# Patient Record
Sex: Male | Born: 1935 | Race: White | Hispanic: No
Health system: Southern US, Community
[De-identification: ages and names within clinical notes are randomized; demographics above are authoritative.]

## PROBLEM LIST (undated history)

## (undated) DIAGNOSIS — R569 Unspecified convulsions: Secondary | ICD-10-CM

---

## 2005-05-09 ENCOUNTER — Inpatient Hospital Stay: Payer: Self-pay | Admitting: Vascular Surgery

## 2005-05-09 ENCOUNTER — Other Ambulatory Visit: Payer: Self-pay

## 2005-05-14 ENCOUNTER — Other Ambulatory Visit: Payer: Self-pay

## 2005-05-14 ENCOUNTER — Ambulatory Visit: Payer: Self-pay | Admitting: Surgery

## 2007-10-04 ENCOUNTER — Ambulatory Visit: Payer: Self-pay | Admitting: Gastroenterology

## 2011-11-22 LAB — COMPREHENSIVE METABOLIC PANEL
Alkaline Phosphatase: 75 U/L (ref 50–136)
Anion Gap: 6 — ABNORMAL LOW (ref 7–16)
BUN: 4 mg/dL — ABNORMAL LOW (ref 7–18)
Bilirubin,Total: 0.6 mg/dL (ref 0.2–1.0)
Calcium, Total: 8.7 mg/dL (ref 8.5–10.1)
Co2: 32 mmol/L (ref 21–32)
EGFR (African American): >60
Glucose: 108 mg/dL — ABNORMAL HIGH (ref 65–99)
Osmolality: 277 (ref 275–301)
Potassium: 3.2 mmol/L — ABNORMAL LOW (ref 3.5–5.1)
SGOT(AST): 35 U/L (ref 15–37)
SGPT (ALT): 31 U/L

## 2011-11-22 LAB — URINALYSIS, COMPLETE
Bacteria: NONE SEEN
Bilirubin,UR: NEGATIVE
Blood: NEGATIVE
Glucose,UR: NEGATIVE mg/dL (ref 0–75)
Leukocyte Esterase: NEGATIVE
Nitrite: NEGATIVE
Protein: NEGATIVE
Specific Gravity: 1.01 (ref 1.003–1.030)

## 2011-11-22 LAB — CBC
HCT: 49.9 % (ref 40.0–52.0)
HGB: 16.6 g/dL (ref 13.0–18.0)
MCV: 89 fL (ref 80–100)
RBC: 5.61 10*6/uL (ref 4.40–5.90)
RDW: 14.8 % — ABNORMAL HIGH (ref 11.5–14.5)
WBC: 5.4 10*3/uL (ref 3.8–10.6)

## 2011-11-22 LAB — TROPONIN I: Troponin-I: <0.02 ng/mL

## 2011-11-23 ENCOUNTER — Observation Stay: Payer: Self-pay | Admitting: Internal Medicine

## 2011-11-25 ENCOUNTER — Encounter: Payer: Self-pay | Admitting: Internal Medicine

## 2011-12-23 ENCOUNTER — Encounter: Payer: Self-pay | Admitting: Internal Medicine

## 2012-04-04 ENCOUNTER — Emergency Department: Payer: Self-pay | Admitting: Emergency Medicine

## 2012-04-04 LAB — URINALYSIS, COMPLETE
Bilirubin,UR: NEGATIVE
Blood: NEGATIVE
Glucose,UR: NEGATIVE mg/dL (ref 0–75)
Ketone: NEGATIVE
Nitrite: NEGATIVE
Ph: 5 (ref 4.5–8.0)
Protein: NEGATIVE
Specific Gravity: 1.02 (ref 1.003–1.030)
Squamous Epithelial: <1
WBC UR: 2 /HPF (ref 0–5)

## 2012-04-04 LAB — COMPREHENSIVE METABOLIC PANEL
Albumin: 4.1 g/dL (ref 3.4–5.0)
Alkaline Phosphatase: 66 U/L (ref 50–136)
Bilirubin,Total: 0.5 mg/dL (ref 0.2–1.0)
Calcium, Total: 8.9 mg/dL (ref 8.5–10.1)
Creatinine: 1.18 mg/dL (ref 0.60–1.30)
EGFR (Non-African Amer.): 59 — ABNORMAL LOW
Glucose: 122 mg/dL — ABNORMAL HIGH (ref 65–99)
Osmolality: 284 (ref 275–301)
SGPT (ALT): 23 U/L (ref 12–78)
Sodium: 142 mmol/L (ref 136–145)
Total Protein: 7.3 g/dL (ref 6.4–8.2)

## 2012-04-04 LAB — CBC
HGB: 16.4 g/dL (ref 13.0–18.0)
MCH: 30.4 pg (ref 26.0–34.0)
Platelet: 182 10*3/uL (ref 150–440)
RBC: 5.39 10*6/uL (ref 4.40–5.90)
RDW: 14.2 % (ref 11.5–14.5)

## 2012-04-04 LAB — TROPONIN I: Troponin-I: <0.02 ng/mL

## 2013-02-20 ENCOUNTER — Emergency Department: Payer: Self-pay

## 2013-02-20 LAB — COMPREHENSIVE METABOLIC PANEL
Alkaline Phosphatase: 77 U/L (ref 50–136)
Anion Gap: 6 — ABNORMAL LOW (ref 7–16)
BUN: 10 mg/dL (ref 7–18)
Calcium, Total: 8.7 mg/dL (ref 8.5–10.1)
Chloride: 103 mmol/L (ref 98–107)
Co2: 27 mmol/L (ref 21–32)
Creatinine: 1.08 mg/dL (ref 0.60–1.30)
EGFR (African American): >60
Glucose: 132 mg/dL — ABNORMAL HIGH (ref 65–99)
SGOT(AST): 25 U/L (ref 15–37)
Total Protein: 7 g/dL (ref 6.4–8.2)

## 2013-02-20 LAB — ETHANOL
Ethanol %: <0.003 % (ref 0.000–0.080)
Ethanol: <3 mg/dL

## 2013-02-20 LAB — URINALYSIS, COMPLETE
Bacteria: NONE SEEN
Blood: NEGATIVE
Glucose,UR: NEGATIVE mg/dL (ref 0–75)
Ketone: NEGATIVE
Leukocyte Esterase: NEGATIVE
Nitrite: NEGATIVE
RBC,UR: <1 /HPF (ref 0–5)
Squamous Epithelial: NONE SEEN
WBC UR: <1 /HPF (ref 0–5)

## 2013-02-20 LAB — DRUG SCREEN, URINE
Amphetamines, Ur Screen: NEGATIVE (ref ?–1000)
Barbiturates, Ur Screen: NEGATIVE (ref ?–200)
Benzodiazepine, Ur Scrn: NEGATIVE (ref ?–200)
Cannabinoid 50 Ng, Ur ~~LOC~~: NEGATIVE (ref ?–50)
Cocaine Metabolite,Ur ~~LOC~~: NEGATIVE (ref ?–300)
MDMA (Ecstasy)Ur Screen: NEGATIVE (ref ?–500)
Methadone, Ur Screen: NEGATIVE (ref ?–300)
Phencyclidine (PCP) Ur S: NEGATIVE (ref ?–25)

## 2013-02-20 LAB — CBC
HGB: 16.2 g/dL (ref 13.0–18.0)
MCH: 29.5 pg (ref 26.0–34.0)
RBC: 5.5 10*6/uL (ref 4.40–5.90)
WBC: 6.5 10*3/uL (ref 3.8–10.6)

## 2013-02-21 ENCOUNTER — Ambulatory Visit: Payer: Self-pay | Admitting: Nurse Practitioner

## 2013-03-25 ENCOUNTER — Emergency Department (HOSPITAL_COMMUNITY)
Admission: EM | Admit: 2013-03-25 | Discharge: 2013-03-26 | Disposition: A | Discharge status: Skilled Nursing Facility | Payer: Medicare Other | Attending: Emergency Medicine | Admitting: Emergency Medicine

## 2013-03-25 DIAGNOSIS — F29 Unspecified psychosis not due to a substance or known physiological condition: Secondary | ICD-10-CM | POA: Insufficient documentation

## 2013-03-25 DIAGNOSIS — Z043 Encounter for examination and observation following other accident: Secondary | ICD-10-CM | POA: Insufficient documentation

## 2013-03-25 DIAGNOSIS — IMO0002 Reserved for concepts with insufficient information to code with codable children: Secondary | ICD-10-CM | POA: Insufficient documentation

## 2013-03-25 DIAGNOSIS — W19XXXA Unspecified fall, initial encounter: Secondary | ICD-10-CM | POA: Insufficient documentation

## 2013-03-25 DIAGNOSIS — Y9210 Unspecified residential institution as the place of occurrence of the external cause: Secondary | ICD-10-CM | POA: Insufficient documentation

## 2013-03-25 DIAGNOSIS — Z79899 Other long term (current) drug therapy: Secondary | ICD-10-CM | POA: Insufficient documentation

## 2013-03-25 DIAGNOSIS — M4000 Postural kyphosis, site unspecified: Secondary | ICD-10-CM | POA: Insufficient documentation

## 2013-03-25 DIAGNOSIS — Y939 Activity, unspecified: Secondary | ICD-10-CM | POA: Insufficient documentation

## 2013-03-25 DIAGNOSIS — F0390 Unspecified dementia without behavioral disturbance: Secondary | ICD-10-CM | POA: Insufficient documentation

## 2013-03-25 HISTORY — DX: Unspecified convulsions: R56.9

## 2013-03-25 NOTE — ED Notes (Signed)
Pt to ED via [facility] from [facility] after reported falling.  Staff reported to EMS that they found pt on floor.

## 2013-03-26 ENCOUNTER — Emergency Department (HOSPITAL_COMMUNITY): Payer: Medicare Other

## 2013-03-26 ENCOUNTER — Encounter (HOSPITAL_COMMUNITY): Payer: Self-pay | Admitting: Emergency Medicine

## 2013-03-26 LAB — BASIC METABOLIC PANEL
BUN: 12 mg/dL (ref 6–23)
Chloride: 102 mEq/L (ref 96–112)
GFR calc Af Amer: 89 mL/min — ABNORMAL LOW (ref >90)
GFR calc non Af Amer: 77 mL/min — ABNORMAL LOW (ref >90)
Glucose, Bld: 99 mg/dL (ref 70–99)
Potassium: 3.6 mEq/L (ref 3.5–5.1)
Sodium: 139 mEq/L (ref 135–145)

## 2013-03-26 LAB — URINALYSIS, ROUTINE W REFLEX MICROSCOPIC
Glucose, UA: NEGATIVE mg/dL
Leukocytes, UA: NEGATIVE
Nitrite: NEGATIVE
Protein, ur: NEGATIVE mg/dL
Specific Gravity, Urine: 1.029 (ref 1.005–1.030)
pH: 5 (ref 5.0–8.0)

## 2013-03-26 LAB — CBC
HCT: 45.9 % (ref 39.0–52.0)
Hemoglobin: 16 g/dL (ref 13.0–17.0)
MCHC: 34.9 g/dL (ref 30.0–36.0)
MCV: 85.6 fL (ref 78.0–100.0)
RBC: 5.36 MIL/uL (ref 4.22–5.81)
WBC: 5.4 10*3/uL (ref 4.0–10.5)

## 2013-03-26 LAB — URINE MICROSCOPIC-ADD ON

## 2013-03-26 NOTE — ED Provider Notes (Signed)
CSN: [id number]     Arrival date & time 03/25/13  2335 History   First MD Initiated Contact with Patient 03/26/13 0011     Chief Complaint  Patient presents with  . Fall   (Consider location/radiation/quality/duration/timing/severity/associated sxs/prior Treatment) HPI This is an elderly man with a history of dementia who was brought to the emergency department by ambulance from her skilled nursing facility where he resides. He came from [facility]. Staff told the patient denies that he has been a resident there for a one week and that they do not know very much about him.   I am unable to obtain any useful information from the patient who is alert but, seemingly, very confused.  History reviewed. No pertinent past medical history. History reviewed. No pertinent past surgical history. No family history on file. History  Substance Use Topics  . Smoking status: Not on file  . Smokeless tobacco: Not on file  . Alcohol Use: Not on file    Review of Systems Unable to obtain review of systems from the patient because of his altered mental status. Please note level V caveat  Allergies  Review of patient's allergies indicates not on file.  Home Medications   Current Outpatient Rx  Name  Route  Sig  Dispense  Refill  . acetaminophen (TYLENOL) 325 MG tablet   Oral   Take 650 mg by mouth every 6 (six) hours as needed for pain.         Marland Kitchen alum & mag hydroxide-simeth (MAALOX PLUS) 400-400-40 MG/5ML suspension   Oral   Take 30 mLs by mouth as needed for indigestion.          . Melatonin 3 MG TABS   Oral   Take 1 tablet by mouth at bedtime.         . Oxcarbazepine (TRILEPTAL) 300 MG tablet   Oral   Take 300 mg by mouth 2 (two) times daily.         . QUEtiapine (SEROQUEL) 25 MG tablet   Oral   Take 12.5 mg by mouth at bedtime.         . traZODone (DESYREL) 50 MG tablet   Oral   Take 50 mg by mouth at bedtime.          BP 114/76  Temp(Src) 97.9 F (36.6 C)  (Oral)  SpO2 98% Physical Exam Gen: well developed and well nourished appearing, does not appear to be in acute distress Head: NCAT Eyes: PERL, EOMI Nose: no epistaixis or rhinorrhea Mouth/throat: mucosa is moist and pink Neck: supple, no c spine ttp Lungs: CTA B, no wheezing, rhonchi or rales CV: RRR, good  Peripheral pulses x 4 Abd: soft, notender, nondistended Back: mild kyhposis, no midline ttp Skin: warm and dry, no echymosis, abrasion, laceration, hematoma identified  Neuro: CN ii-xii grossly intact, no focal motor deficits, the patient is unable to name himself, the place or the date,  Psyche; sleeping but easily arousble then mildly agitated affect Ext: no deformities, normal to inspection, FROM without pain all joint of all extremities  ED Course  Procedures (including critical care time) Labs Review  Results for orders placed during the hospital encounter of 03/25/13 (from the past 24 hour(s))  URINALYSIS, ROUTINE W REFLEX MICROSCOPIC     Status: Abnormal   Collection Time    03/26/13  1:54 AM      Result Value Range   Color, Urine AMBER (*) YELLOW   APPearance CLEAR  CLEAR  Specific Gravity, Urine 1.029  1.005 - 1.030   pH 5.0  5.0 - 8.0   Glucose, UA NEGATIVE  NEGATIVE mg/dL   Hgb urine dipstick MODERATE (*) NEGATIVE   Bilirubin Urine SMALL (*) NEGATIVE   Ketones, ur 15 (*) NEGATIVE mg/dL   Protein, ur NEGATIVE  NEGATIVE mg/dL   Urobilinogen, UA 0.2  0.0 - 1.0 mg/dL   Nitrite NEGATIVE  NEGATIVE   Leukocytes, UA NEGATIVE  NEGATIVE  URINE MICROSCOPIC-ADD ON     Status: None   Collection Time    03/26/13  1:54 AM      Result Value Range   Squamous Epithelial / LPF RARE  RARE   RBC / HPF 11-20  <3 RBC/hpf   Bacteria, UA RARE  RARE  CBC     Status: None   Collection Time    03/26/13  2:45 AM      Result Value Range   WBC 5.4  4.0 - 10.5 K/uL   RBC 5.36  4.22 - 5.81 MIL/uL   Hemoglobin 16.0  13.0 - 17.0 g/dL   HCT 40.9  81.1 - 91.4 %   MCV 85.6  78.0 -  100.0 fL   MCH 29.9  26.0 - 34.0 pg   MCHC 34.9  30.0 - 36.0 g/dL   RDW 78.2  95.6 - 21.3 %   Platelets 174  150 - 400 K/uL  BASIC METABOLIC PANEL     Status: Abnormal   Collection Time    03/26/13  2:45 AM      Result Value Range   Sodium 139  135 - 145 mEq/L   Potassium 3.6  3.5 - 5.1 mEq/L   Chloride 102  96 - 112 mEq/L   CO2 27  19 - 32 mEq/L   Glucose, Bld 99  70 - 99 mg/dL   BUN 12  6 - 23 mg/dL   Creatinine, Ser 0.86  0.50 - 1.35 mg/dL   Calcium 8.9  8.4 - 57.8 mg/dL   GFR calc non Af Amer 77 (*) >90 mL/min   GFR calc Af Amer 89 (*) >90 mL/min   CXR - no acute process  CT brain - encephalomalacia, chronic infarct  EKG: NSR, normal rate, normal axis, normal qrs  MDM  Patient with history of dementia. More alert than on arrival. Confused and agitated. No acute traumatic injuries identified. Patient is stable for discharge.     Brandt Loosen, MD 03/26/13 [phone]

## 2013-03-26 NOTE — ED Notes (Signed)
Pt refused to do pelvic xray

## 2013-03-26 NOTE — ED Notes (Addendum)
Pt ambulating in hallway. Stable on feet. No complaints of pain.

## 2013-04-10 ENCOUNTER — Encounter (HOSPITAL_COMMUNITY): Payer: Self-pay | Admitting: Emergency Medicine

## 2013-04-10 ENCOUNTER — Emergency Department (HOSPITAL_COMMUNITY)
Admission: EM | Admit: 2013-04-10 | Discharge: 2013-04-12 | Disposition: A | Discharge status: Skilled Nursing Facility | Payer: Medicare Other | Attending: Emergency Medicine | Admitting: Emergency Medicine

## 2013-04-10 DIAGNOSIS — Z79899 Other long term (current) drug therapy: Secondary | ICD-10-CM | POA: Insufficient documentation

## 2013-04-10 DIAGNOSIS — F0390 Unspecified dementia without behavioral disturbance: Secondary | ICD-10-CM

## 2013-04-10 DIAGNOSIS — F911 Conduct disorder, childhood-onset type: Secondary | ICD-10-CM | POA: Insufficient documentation

## 2013-04-10 DIAGNOSIS — N390 Urinary tract infection, site not specified: Secondary | ICD-10-CM

## 2013-04-10 DIAGNOSIS — G40909 Epilepsy, unspecified, not intractable, without status epilepticus: Secondary | ICD-10-CM | POA: Insufficient documentation

## 2013-04-10 DIAGNOSIS — R4689 Other symptoms and signs involving appearance and behavior: Secondary | ICD-10-CM

## 2013-04-10 LAB — CBC WITH DIFFERENTIAL/PLATELET
Basophils Absolute: 0 10*3/uL (ref 0.0–0.1)
Basophils Relative: 1 % (ref 0–1)
Eosinophils Absolute: 0.1 10*3/uL (ref 0.0–0.7)
Eosinophils Relative: 2 % (ref 0–5)
HCT: 45.9 % (ref 39.0–52.0)
Hemoglobin: 15.2 g/dL (ref 13.0–17.0)
Lymphocytes Relative: 30 % (ref 12–46)
Lymphs Abs: 1.3 10*3/uL (ref 0.7–4.0)
MCH: 29.1 pg (ref 26.0–34.0)
MCHC: 33.1 g/dL (ref 30.0–36.0)
MCV: 87.9 fL (ref 78.0–100.0)
Monocytes Absolute: 0.4 10*3/uL (ref 0.1–1.0)
Monocytes Relative: 9 % (ref 3–12)
Neutro Abs: 2.6 10*3/uL (ref 1.7–7.7)
Neutrophils Relative %: 59 % (ref 43–77)
Platelets: 165 10*3/uL (ref 150–400)
RBC: 5.22 MIL/uL (ref 4.22–5.81)
RDW: 14.8 % (ref 11.5–15.5)
WBC: 4.3 10*3/uL (ref 4.0–10.5)

## 2013-04-10 LAB — URINALYSIS, ROUTINE W REFLEX MICROSCOPIC
Bilirubin Urine: NEGATIVE
Glucose, UA: NEGATIVE mg/dL
Hgb urine dipstick: NEGATIVE
Ketones, ur: NEGATIVE mg/dL
Nitrite: NEGATIVE
Protein, ur: NEGATIVE mg/dL
Specific Gravity, Urine: 1.03 (ref 1.005–1.030)
Urobilinogen, UA: 0.2 mg/dL (ref 0.0–1.0)
pH: 7.5 (ref 5.0–8.0)

## 2013-04-10 LAB — COMPREHENSIVE METABOLIC PANEL
ALT: 16 U/L (ref 0–53)
AST: 20 U/L (ref 0–37)
Albumin: 3.9 g/dL (ref 3.5–5.2)
Alkaline Phosphatase: 71 U/L (ref 39–117)
BUN: 10 mg/dL (ref 6–23)
CO2: 29 mEq/L (ref 19–32)
Calcium: 9.4 mg/dL (ref 8.4–10.5)
Chloride: 103 mEq/L (ref 96–112)
Creatinine, Ser: 0.89 mg/dL (ref 0.50–1.35)
GFR calc Af Amer: >90 mL/min (ref >90)
GFR calc non Af Amer: 80 mL/min — ABNORMAL LOW (ref >90)
Glucose, Bld: 102 mg/dL — ABNORMAL HIGH (ref 70–99)
Potassium: 3.8 mEq/L (ref 3.5–5.1)
Sodium: 140 mEq/L (ref 135–145)
Total Bilirubin: 0.4 mg/dL (ref 0.3–1.2)
Total Protein: 7.1 g/dL (ref 6.0–8.3)

## 2013-04-10 LAB — URINE MICROSCOPIC-ADD ON

## 2013-04-10 MED ORDER — CIPROFLOXACIN HCL 500 MG PO TABS
500.0000 mg | ORAL_TABLET | Freq: Once | ORAL | Status: AC
  Administered 2013-04-10: 500 mg via ORAL
  Filled 2013-04-10: qty 1

## 2013-04-10 MED ORDER — CIPROFLOXACIN HCL 500 MG PO TABS
500.0000 mg | ORAL_TABLET | Freq: Two times a day (BID) | ORAL | Status: DC
  Administered 2013-04-11 – 2013-04-12 (×3): 500 mg via ORAL
  Filled 2013-04-10 (×3): qty 1

## 2013-04-10 MED ORDER — HALOPERIDOL LACTATE 5 MG/ML IJ SOLN
3.0000 mg | Freq: Once | INTRAMUSCULAR | Status: AC
  Administered 2013-04-10: 3 mg via INTRAMUSCULAR
  Filled 2013-04-10: qty 1

## 2013-04-10 MED ORDER — QUETIAPINE FUMARATE 50 MG PO TABS
50.0000 mg | ORAL_TABLET | Freq: Two times a day (BID) | ORAL | Status: DC
  Administered 2013-04-10 – 2013-04-12 (×4): 50 mg via ORAL
  Filled 2013-04-10 (×5): qty 1

## 2013-04-10 MED ORDER — QUETIAPINE FUMARATE 25 MG PO TABS
12.5000 mg | ORAL_TABLET | Freq: Every day | ORAL | Status: DC
  Administered 2013-04-10: 75 mg via ORAL
  Administered 2013-04-11: 12.5 mg via ORAL
  Filled 2013-04-10 (×2): qty 1

## 2013-04-10 MED ORDER — TRAZODONE HCL 50 MG PO TABS
50.0000 mg | ORAL_TABLET | Freq: Every day | ORAL | Status: DC
  Administered 2013-04-10 – 2013-04-11 (×2): 50 mg via ORAL
  Filled 2013-04-10 (×2): qty 1

## 2013-04-10 MED ORDER — HALOPERIDOL 2 MG PO TABS
2.0000 mg | ORAL_TABLET | Freq: Four times a day (QID) | ORAL | Status: DC | PRN
  Administered 2013-04-11 – 2013-04-12 (×3): 2 mg via ORAL
  Filled 2013-04-10 (×4): qty 1

## 2013-04-10 MED ORDER — OXCARBAZEPINE 300 MG PO TABS
300.0000 mg | ORAL_TABLET | Freq: Two times a day (BID) | ORAL | Status: DC
  Administered 2013-04-10 – 2013-04-12 (×4): 300 mg via ORAL
  Filled 2013-04-10 (×7): qty 1

## 2013-04-10 MED ORDER — MELATONIN 3 MG PO TABS: 1.0000 | ORAL_TABLET | Freq: Every day | ORAL | Status: DC

## 2013-04-10 MED ORDER — LORAZEPAM 1 MG PO TABS
1.0000 mg | ORAL_TABLET | Freq: Once | ORAL | Status: AC
  Administered 2013-04-10: 1 mg via ORAL
  Filled 2013-04-10: qty 2
  Filled 2013-04-10: qty 1

## 2013-04-10 MED ORDER — ALUM & MAG HYDROXIDE-SIMETH 400-400-40 MG/5ML PO SUSP
30.0000 mL | ORAL | Status: DC | PRN
  Filled 2013-04-10: qty 30

## 2013-04-10 NOTE — ED Notes (Signed)
Upon arrival to floor for report,  Pt is urinating in floor and now has began wondering halls. Morrie Sheldon RN is speaking with MD about pt's behavior.

## 2013-04-10 NOTE — Progress Notes (Signed)
PHARMACIST - PHYSICIAN ORDER COMMUNICATION  CONCERNING: P&T Medication Policy on Herbal Medications  DESCRIPTION:  This patient's order for:  MELATONIN has been noted.  This product(s) is classified as an "herbal" or natural product. Due to a lack of definitive safety studies or FDA approval, nonstandard manufacturing practices, plus the potential risk of unknown drug-drug interactions while on inpatient medications, the Pharmacy and Therapeutics Committee does not permit the use of "herbal" or natural products of this type within [facility].   ACTION TAKEN: The pharmacy department is unable to verify this order at this time and your patient has been informed of this safety policy. Please reevaluate patient's clinical condition at discharge and address if the herbal or natural product(s) should be resumed at that time.   Clydene Fake PharmD Pager #: [phone] 6:01 PM 04/10/2013

## 2013-04-10 NOTE — ED Notes (Signed)
Per EMS ambulatory pt from [facility] reports to ED for aggression.  DNR, Hx of seizures and dementia. Mentally at baseline per EMS.

## 2013-04-10 NOTE — ED Notes (Signed)
Patient continues to get oob without assistance. Patient has been encouraged and informed to remain in bed. And call for help to get oob for safety reasons. Will cont to monitor patient.

## 2013-04-10 NOTE — ED Notes (Signed)
PT NEED ENCOURAGEMENT TO REMAIN IN ROOM. PT PLEASANT AND COOPERATE

## 2013-04-10 NOTE — ED Notes (Signed)
CRYSTAL FROM [facility] STATES PT HAS HAD SEVERAL BEHAVIOR EVENTS AT THEIR FACILITY AND WILL NEED BEHAVIOR ASSESSMENT WITH TREATMENT BEFORE PT WILL BE ACCEPTED BACK TO FACILITY OTHERWISE PT WILL HAVE TO BE PLACE ELSEWHERE.  PER DAUGHTER [name] PT HAS BEEN PLACED AT 3 OTHER FACILITIES AND HAS BEEN MOVED BECAUSE OF BEHAVIOR PROBLEMS. SEE CHART.

## 2013-04-10 NOTE — ED Provider Notes (Signed)
CSN: [id number]     Arrival date & time 04/10/13  1230 History   First MD Initiated Contact with Patient 04/10/13 1459     Chief Complaint  Patient presents with  . Aggressive Behavior   (Consider location/radiation/quality/duration/timing/severity/associated sxs/prior Treatment) HPI  80yM sent for evaluation of aggressive behavior. Unfortunately not much additional history. Exact details of this not clear from transfer summary, but pt apparently has a history of aggressive behavior and has been dismissed from several other nursing homes because of his behavior. [facility] will only accept pt back after he has a psychiatric evaluation and deemed appropriate to go back to their facility. Pt with no complaints. When asked if anything is bothering him he replied: "Shit no. I'm like an old [place]. I just go." Pt generally rambling and seems confused. Often times losing train of thought mid sentence. No reported trauma. Pt thinks he is still in [place] which is where he presumably once resided. 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   Past Medical History  Diagnosis Date  . Seizures    History reviewed. No pertinent past surgical history. History reviewed. No pertinent family history. History  Substance Use Topics  . Smoking status: Not on file  . Smokeless tobacco: Not on file  . Alcohol Use: No    Review of Systems  All systems reviewed and negative, other than as noted in HPI.   Allergies  Review of patient's allergies indicates no known allergies.  Home Medications   Current Outpatient Rx  Name  Route  Sig  Dispense  Refill  . acetaminophen (TYLENOL) 325 MG tablet   Oral   Take 650 mg by mouth every 6 (six) hours as needed for pain.         Marland Kitchen alum & mag hydroxide-simeth (MAALOX PLUS) 400-400-40 MG/5ML suspension   Oral   Take 30 mLs by mouth as needed for indigestion.          . haloperidol (HALDOL) 2 MG tablet   Oral   Take 2 mg by mouth every 6 (six)  hours as needed for agitation.         . Melatonin 3 MG TABS   Oral   Take 1 tablet by mouth at bedtime.         . Oxcarbazepine (TRILEPTAL) 300 MG tablet   Oral   Take 300 mg by mouth 2 (two) times daily.         . QUEtiapine (SEROQUEL) 25 MG tablet   Oral   Take 12.5 mg by mouth at bedtime.         Marland Kitchen QUEtiapine (SEROQUEL) 50 MG tablet   Oral   Take 50 mg by mouth 2 (two) times daily.         . traZODone (DESYREL) 50 MG tablet   Oral   Take 50 mg by mouth at bedtime.          BP 120/72  Pulse 56  Temp(Src) 98.1 F (36.7 C) (Oral)  Resp 18  SpO2 97% Physical Exam  Nursing note and vitals reviewed. Constitutional: He appears well-developed and well-nourished. No distress.  HENT:  Head: Normocephalic and atraumatic.  Eyes: Conjunctivae are normal. Pupils are equal, round, and reactive to light. Right eye exhibits no discharge. Left eye exhibits no discharge.  Neck: Neck supple.  Cardiovascular: Normal rate, regular rhythm and normal heart sounds.  Exam reveals no gallop and no friction rub.   No murmur heard. Pulmonary/Chest: Effort normal and  breath sounds normal. No respiratory distress.  Abdominal: Soft. He exhibits no distension. There is no tenderness.  Musculoskeletal: He exhibits no edema and no tenderness.  Neurological: He is alert.  Skin: Skin is warm and dry. He is not diaphoretic.  Psychiatric: He has a normal mood and affect.  Laying in bed. Somewhat restless and frequently rubbing legs. Speech clear. Starts answers questions appropriately but then either becomes tangential or just stops in mid sentences.     ED Course  Procedures (including critical care time) Labs Review Labs Reviewed  COMPREHENSIVE METABOLIC PANEL - Abnormal; Notable for the following:    Glucose, Bld 102 (*)    GFR calc non Af Amer 80 (*)    All other components within normal limits  URINALYSIS, ROUTINE W REFLEX MICROSCOPIC - Abnormal; Notable for the following:     APPearance CLOUDY (*)    Leukocytes, UA SMALL (*)    All other components within normal limits  URINE MICROSCOPIC-ADD ON - Abnormal; Notable for the following:    Bacteria, UA MANY (*)    All other components within normal limits  URINE CULTURE  CBC WITH DIFFERENTIAL   Imaging Review No results found.  EKG Interpretation   None       MDM   1. Aggressive behavior   2. UTI (urinary tract infection)     80yM with aggressive behavior. Has a hx of same. Does not appear to be acute issue. On medications presumably to address this. Will medically clear. Will obtain psychiatry consultation to help assess appropriateness for discharge back to current facility or in need of additional services as well as possible medication recommendations.    4:58 PM UA with possible UTI. Abx ordered.     Raeford Razor, MD 04/10/13 1700

## 2013-04-11 ENCOUNTER — Emergency Department (HOSPITAL_COMMUNITY): Payer: Medicare Other

## 2013-04-11 ENCOUNTER — Encounter (HOSPITAL_COMMUNITY): Payer: Self-pay | Admitting: Registered Nurse

## 2013-04-11 DIAGNOSIS — F0390 Unspecified dementia without behavioral disturbance: Secondary | ICD-10-CM

## 2013-04-11 DIAGNOSIS — F603 Borderline personality disorder: Secondary | ICD-10-CM

## 2013-04-11 LAB — URINE CULTURE
Colony Count: NO GROWTH
Culture: NO GROWTH

## 2013-04-11 NOTE — ED Notes (Signed)
Pt ate 100% of meal.

## 2013-04-11 NOTE — ED Notes (Signed)
Breakfast tray to bedside.  Pt is sleeping.

## 2013-04-11 NOTE — ED Notes (Signed)
Pt stood in doorway of room and urinated in the floor.  Pt confused and unaware of actions.  Pt redirected to bed.  Diaper replaced and pants put back on.  Pt educated on bathroom use.  Light is on in bathroom to help pt find his way to it when needed.

## 2013-04-11 NOTE — ED Notes (Signed)
Patient transported to X-ray 
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
## 2013-04-11 NOTE — Progress Notes (Signed)
Pt referred to [facility], [facility], and [facility].  No beds available at [facility] and [facility]  .Marva Panda, LCSWA  [phone]  .04/11/2013

## 2013-04-11 NOTE — Progress Notes (Signed)
[facility] ED CM answered a call from Crystal from pt's facility inquiring about an update on pt. She was informed that ED [facility] staff were working on placement for pt but not sure if he would be returned to facility per her inquiry). Crystal requests a return call from ED SW for clarity on disposition plan. Crystal states her facility does psych evaluation visits weekly and reports pt would not be able to return to facility unless he was more mentally stable. ED CM called ED SW to update her on call from Crystal and request for a follow up call r/t disposition

## 2013-04-11 NOTE — ED Provider Notes (Signed)
Date: 04/11/2013  Rate: 54  Rhythm: sinus bradycardia  QRS Axis: normal  Intervals: normal  ST/T Wave abnormalities: normal  Conduction Disutrbances:none  Narrative Interpretation:   Old EKG Reviewed: unchanged    Gilda Crease, MD 04/11/13 1758
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
## 2013-04-12 ENCOUNTER — Encounter (HOSPITAL_COMMUNITY): Payer: Self-pay | Admitting: Registered Nurse

## 2013-04-12 MED ORDER — CIPROFLOXACIN HCL 250 MG PO TABS: 250.0000 mg | ORAL_TABLET | Freq: Two times a day (BID) | ORAL | Status: AC
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
## 2013-04-12 NOTE — ED Notes (Signed)
Pt ambulating in room, walked to BR unassited and back to bed at this time

## 2013-04-12 NOTE — Progress Notes (Signed)
[facility] consulted by EDSW for home health services.  EDCM spoke to [employer] at [facility] at [phone].  As per Aggie Cosier, their facility uses [place] [facility] for home health services.  EDCM placed orders for home health psych  RN for disease management and medication assistance and physical therapy.  Call placed to Venia Minks of [place] to make her aware of the new orders.  Awaiting call back.

## 2013-04-12 NOTE — ED Notes (Signed)
[facility] called for transport.  

## 2013-04-12 NOTE — Progress Notes (Signed)
Crystal at [facility] confirmed patient's pcp at the facility is Dr. Cristi Loron.  System updated.

## 2013-04-12 NOTE — Consult Note (Signed)
  Psychiatric Specialty Exam: Physical Exam  ROS  Blood pressure 93/49, pulse 58, temperature 97.9 F (36.6 C), temperature source Oral, resp. rate 16, SpO2 95.00%.There is no height or weight on file to calculate BMI.  General Appearance: Disheveled  Eye Contact::  Minimal  Speech:  Slow  Volume:  Decreased  Mood:  Euthymic  Affect:  Blunt  Thought Process:  poverty of speech   Orientation:  Other:  not oriented except to his nome  Thought Content:  Negative  Suicidal Thoughts:  No  Homicidal Thoughts:  No  Memory:  Immediate;   Poor Recent;   Poor Remote;   Poor  Judgement:  Impaired  Insight:  Lacking  Psychomotor Activity:  currently in bed  Concentration:  Poor  Recall:  Poor  Akathisia:  Negative  Handed:  Right  AIMS (if indicated):     Assets:  Housing  Sleep:   good   [name] is better today.  He makes better eye contact and is appropriate though he does not know the answers to most questions.  He has not been combative and has been cooperative.  The UTI is being treated and perhaps that is why he is better or it might be just a change of place.  He is free to be returned to assisted living today.

## 2013-04-12 NOTE — Progress Notes (Signed)
Referred to [place]. [facility]ane [facility], [place], [place] and [place].   Incoming staff will follow-up with referrals.

## 2013-04-12 NOTE — ED Notes (Signed)
Attempted to give pt Haldol as he was getting agitated, pt spit tablet in the toilet.

## 2013-04-12 NOTE — Discharge Instructions (Signed)
Back to NH.  If you were given medicines take as directed.  If you are on coumadin or contraceptives realize their levels and effectiveness is altered by many different medicines.  If you have any reaction (rash, tongues swelling, other) to the medicines stop taking and see a physician.   Please follow up as directed and return to the ER or see a physician for new or worsening symptoms.  Thank you.

## 2013-04-12 NOTE — Progress Notes (Signed)
Return call received from Venia Minks of [place].  Will acknowledge orders and initiate home health services as per Debbie.

## 2013-04-12 NOTE — Progress Notes (Addendum)
CSW received call from Hume at [facility] confirming that pt can return.  Social worker confirmed with her that [facility] has set up home health services that includes psych services as requested.  Crystal requested that EDP fax antibiotic orders over to [facility] fax: [phone] prior to pt returning so that they could make sure he has his dosage for tonight.  Crystal reports that she will not be available but that Victorino Dike will be available until 5:30 if there are questions.  Marva Panda, Theresia Majors  [phone] .04/12/2013 3:45 pm  CSW consulted with EDP concerning pts prescription and pts ready to be discharged back to his facility.  CSW faxed pts prescription and confirmed with Crystal that it was received.   CSW alerted pts nurse that pt was ready for discharge and that [facility] could be contacted for transport.  Marva Panda, Theresia Majors  [phone]  .04/12/2013  4:15 pm

## 2013-04-12 NOTE — ED Notes (Signed)
Charge talked to Bunnie Pion at [facility], working on placement.

## 2013-07-22 DEATH — deceased

## 2014-09-13 NOTE — Consult Note (Signed)
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   Patient was seen by consultant.   Consult note dictated.   Orders entered.   Comments: Psychiatry: Patient evaluated and chart reviewed. Patient is 81 year old man with progressive severe dementia. Aggresive at his rest home. Needs either to be placed or go to a gero unit.  Electronic Signatures: Audery Amellapacs, Masiah Lewing T (MD)  (Signed 01-Oct-14 16:51)  Authored: Brief Consult Note   Last Updated: 01-Oct-14 16:51 by Audery Amellapacs, Joss Mcdill T (MD)

## 2014-09-13 NOTE — Consult Note (Signed)
Psychiatry: Followup with this elderly patient with advanced dementia with behavioral disturbance. Today he was back in the main emergency room because he appeared to be unsteady on his feet. When I found him he was lying down on a stretcher area and he was easily arousable. He continues to be confused in his thinking. He was not hostile or aggressive. obtained from his nursing home suggests that he is Trileptal had been increased recently. It doesn'[name] seem like it's made any difference to his presentation. had cut down his Zoloft because I thought that might be a little over agitating for him and add a little bit of low dose antipsychotic. No further change to medication right now. are working on trying to find a nursing home that will take him. If that falls through we will [facility].  Electronic Signatures: [name], [name] (MD)  (Signed on 02-Oct-14 16:13)  Authored  Last Updated: 02-Oct-14 16:13 by [name], [name] (MD)

## 2014-09-13 NOTE — Consult Note (Signed)
PATIENT NAME:  [name], [name] MR#:  [id number] DATE OF BIRTH:  02-08-1933  DATE OF CONSULTATION:  02/21/2013  CONSULTING PHYSICIAN:  Audery Amel, MD  IDENTIFYING INFORMATION AND REASON FOR CONSULT: A 81 year old man sent here from his nursing home because of aggressive behavior. Consultation for disposition and treatment.   HISTORY OF PRESENT ILLNESS: Information obtained primarily from the chart, less so from the patient. Chart indicates that the patient has been increasingly aggressive at his nursing home. He has been assaulting staff and other patients and been out of control with his behavior. They have not been able to manage him recently. Things have been gradually getting worse it sounds like for at least months, probably longer than that. No clear acute stimulus for this. No information about any recent change of medicine. The patient himself is not able to give any history whatsoever.   PAST PSYCHIATRIC HISTORY: The patient has had dementia identified for a couple of years, which has been getting worse rapidly. No other known past psychiatric history identified. He has been treated evidently recently with Zoloft 150 mg per day, Trileptal 300 mg twice a day, Neurontin 200 mg twice a day and Ativan p.r.n. Not sure how long he has been on these or if any other medicines have been tried.   SOCIAL HISTORY: The patient currently resides in a nursing home. It appears that his sons are probably the most closely involved in his care and one of them may be the power of attorney.   PAST MEDICAL HISTORY: According to referral information, there are no known ongoing medical problems apart from his dementia.   FAMILY HISTORY: Unknown.   SUBSTANCE ABUSE HISTORY: None identified.   REVIEW OF SYSTEMS: The patient is not able to offer any information. He does not appear to be in pain. Was not hostile or threatening, did not appear to be responding to internal stimuli.   CURRENT MEDICATIONS: Aspirin  81 mg per day, gabapentin 200 mg twice a day, Trileptal 300 mg twice a day, Zoloft 150 mg per day.   ALLERGIES: LIPITOR, LOVASTATIN AND ZOCOR.   MENTAL STATUS EXAMINATION: Disheveled elderly gentleman looks his stated age. Minimally cooperative with the interview. He is quite hard of hearing and requires all questions to be voiced extremely loudly to him, and even then has trouble understanding it. He does not pay attention for any significant length of time. He is wandering around [place], although it is relatively easy to redirect him at this point. Affect is flat. Mood not stated. Clearly severely cognitively impaired. The patient is disoriented to place, time and situation. He is not engaging in any dangerous behavior towards himself or others right now. Does not appear to be responding to internal stimuli.   LABORATORY RESULTS: Drug screen negative. Chest x-ray unremarkable. Urinalysis normal. TSH 1.49. Alcohol undetected. Chemistry shows elevated glucose at 132. CBC normal.   ASSESSMENT: A 81 year old man with dementia, probably largely Alzheimer's disease related, with behavior disturbance. Has been treated with psychiatric medicines to try and improve the behavior problems at his group home without adequate improvement. Recently has been dangerous to staff and other patients such that they can no longer care for him.   TREATMENT PLAN: The patient would not benefit from and would not be appropriate for the psychiatric inpatient unit here. He needs placement in a facility for severe Alzheimer's disease. If a placement is not available, he needs a geriatric psychiatry unit. Here in the Emergency Room, I will continue  his medicines along with trying to adjust them a little bit to improve their effectiveness. I think the Zoloft should probably be cut to 100 because that could make him more agitated. I am going to increase the Neurontin, also add Abilify 2 mg b.i.d. We are working on possible referral  and also trying to discuss the possibility of return to his nursing home.   DIAGNOSIS, PRINCIPAL AND PRIMARY:  AXIS I: Dementia secondary to Alzheimer's disease with accompanying behavior disturbance. AXIS II: No diagnosis.  AXIS III: No diagnosis.  AXIS IV: Moderate to severe from illness.  AXIS V: Functioning at time of evaluation: 20.   ____________________________ Audery AmelJohn T. Dayvian Blixt, MD jtc:jm D: 02/21/2013 17:01:06 ET T: 02/21/2013 17:27:07 ET JOB#: [id number]  cc: Audery AmelJohn T. Dayna Geurts, MD, <Dictator> Audery AmelJOHN T Franceen Erisman MD ELECTRONICALLY SIGNED 02/21/2013 20:10

## 2014-09-13 NOTE — Consult Note (Signed)
Psychiatry: Patient seen. Follow up. Elderly man with advanced dementia and behavior disturbance. Patient has been more sedated today. A facility tried to evaluate but couldn't as he was so sleepy. Patient with no new complaints. No change to mental state.   I have cut back on trazodone - not needed in the day-  and abilify to only pm. This may improve his alertness so he can be evaluated and hopefully that will facilitatte placement. Meanwhile continue supportive management in the ER.  Electronic Signatures: Audery Amellapacs, Khalee Mazo T (MD)  (Signed on 03-Oct-14 17:48)  Authored  Last Updated: 03-Oct-14 17:48 by Audery Amellapacs, Virgil Slinger T (MD)

## 2014-09-13 NOTE — Consult Note (Signed)
Psychiatry: Followup note for this 81 year old man with advanced dementia and behavior problems. Patient has remained in the emergency room for the past several days hoping that we can find discharge plans. He has not been aggressive violent or hospital. Patient is severely demented. He has tolerated current medication well. I am told at this time that a discharge living situation has been identified. They had requested that some kind of p.r.n. medicine be ordered. I have added Risperdal 0.25 mg q. 6 hours p.r.n. for agitation. Current medications are as follows100 mg per day300 mg twice a day2 mg at night100 mg 3 times a day0.25 mg q.6 hours as needed for agitationcommitment papers have been reversed as the patient is no longer acutely dangerous to himself or others.  Electronic Signatures: Audery Amellapacs, John T (MD)  (Signed on 06-Oct-14 16:05)  Authored  Last Updated: 06-Oct-14 16:05 by Audery Amellapacs, John T (MD)

## 2014-09-15 NOTE — Discharge Summary (Signed)
PATIENT NAME:  [name], [name] MR#:  [id number] DATE OF BIRTH:  [birth year]  DATE OF ADMISSION:  11/23/2011 DATE OF DISCHARGE:  11/24/2011  PRESENTING COMPLAINT: Altered mental status.   DISCHARGE DIAGNOSIS: Severe advanced dementia.   CONDITION ON DISCHARGE: Fair.   MEDICATIONS:  1. Aspirin 81 mg daily.  2. Tylenol 500 p.o. b.i.d. p.r.n.  3. Haldol 2 mg q.i.d. p.r.n. for agitation.   FOLLOW UP: Follow with Dr. Laruth Bouchardamika Lott, [facility], Monday, 07/29 at 1:20 p.m.    CONSULTATION: Dr. Jeanie SewerWilliford, psychiatry.   LABORATORY, DIAGNOSTIC AND RADIOLOGICAL DATA: Labs at discharge: Chest x-ray no acute cardiopulmonary abnormality. CT of the head shows chronic postsurgical changes described, involutional changes with small vessel microangiopathy. [name] wedge-shaped area of low attenuation projects within the posterior parietal occipital region centrally in the right likely representing [name] chronic infarction. CBC within normal limits. Comprehensive metabolic panel within normal limits except potassium of 3.2. Urinalysis negative for urinary tract infection.   BRIEF SUMMARY OF HOSPITAL COURSE: [name] is [name] 81 year old gentleman with history of dementia and comes in with:  1. Altered mental status. Patient had not been able to care for self, wanted to pack his car, leave, wondering around the neighbor's home per daughter. He was admitted with progressive worsening dementia. No source of infection found. No cerebrovascular accident per CT head. No dehydration. It appears to be due to worsening dementia with significant memory loss. Seen by psych. Patient is incompetent to make any decisions. Patient's daughter, [name] is patient's HPCOA. She was agreeable with patient going to assisted living with memory care unit. [facility] and [facility] in [name] stable condition. PPD had been placed prior to patient leaving. [facility] will [facility].  2. CODE STATUS:  Patient remained [name] FULL CODE.   TIME SPENT: 40 minutes.   ____________________________ Wylie HailSona [name]. Allena KatzPatel, MD sap:cms D: 11/25/2011 14:31:50 ET T: 11/26/2011 14:04:26 ET JOB#: [id number]  cc: Daijha Leggio [name]. Allena KatzPatel, MD, <Dictator> Tamika J. Lazarus SalinesLott, MD  Willow OraSONA [name] Amahri Dengel MD ELECTRONICALLY SIGNED 11/26/2011 17:13

## 2014-09-15 NOTE — Consult Note (Signed)
PATIENT NAME:  [name], [name]  DATE OF CONSULTATION:  11/23/2011  REFERRING PHYSICIAN:   CONSULTING PHYSICIAN:  Jeffrey AmasJames S. Kiasia Chou, MD  ADDENDUM (Continuation of dictation)  GENERAL APPEARANCE: [name] is an elderly male lying in a supine position in his hospital bed with no abnormal involuntary movements. He has normal muscle tone. He had no cachexia. His grooming and hygiene are normal.   MENTAL STATUS EXAMINATION: [name] is alert. His eye contact is good. Concentration mildly decreased. His fund of knowledge, use of language and intelligence are below that of his estimated premorbid baseline. On orientation testing he does not know the year, month, or the day of the month. He does not know the date of week or place. He is oriented to person.   Memory testing 3 out of 3 immediate visual objects but 0 out of 3 visual objects on recall. Speech involves normal rate and prosody without dysarthria. Thought process involves some confabulation. There is no looseness of association or tangents. Thought content no thoughts of harming himself or others. No delusions or hallucinations. Insight is poor. Judgment is impaired. Mood is normal. Affect flat.   ASSESSMENT: AXIS I:  1. Mood disorder, not otherwise specified. This is waxing and waning currently in a dormant state. 2. He has a history of delirium that is apparent by his acute history. The delirium by nature can wax and wane. The etiology at this time is unknown. The etiology may have been resolved now. He has undergone an organic work-up. One of the possibilities was that he was out in the heat too long in the context of an old central nervous system.  3. Dementia.   AXIS II: Deferred.   AXIS III: Hypercholesterolemia. See past medical history.   AXIS IV: General medical, primary support group.   AXIS V: 30.  [name] does not have the ability to retain information necessary in  differentiating between his options and their respective risks versus benefits. He also cannot reason well, therefore, he does not have the capacity for informed consent. He does not have the capacity for self care and would be at risk for lethal self neglect if left alone.   RECOMMENDATIONS:  If suspicion would perform additional work-up if this has not already been done in his past evaluation: additional tests for reversible central nervous system etiology; RPR; B12; Folic acid; additional cranial imaging.   He is a candidate for memory enhancing medication including Namenda and Aricept.   It is noted that he did receive a Ziprasidone dosage 20 mg for anti-psychosis anti-agitation. Ziprasidone does have a significant anticholinergic side effect.   Therefore, an alternative medication which has no p.o. and IM flexibility would be Abilify 5 to 10 mg p.o. daily or 10 mg IM daily.   If he is tried off his antipsychotic (standard trial off in the first six months) and no psychotic features return but mood symptoms return, then he would be a good candidate for Depakote which has anti-agitation properties and mood stabilization properties.   For follow-up screening with antipsychotics, would utilize the abnormal involuntary movement scale and monitor for metabolic syndrome. Next, if Depakote is utilized will check a periodic CBC and hepatic panel.   Follow-up with a psychiatrist, preferably a geropsychiatrist.  ____________________________ Jeffrey AmasJames S. Minoru Chap, MD jsw:drc D: 11/28/2011 19:24:59 ET T: 11/29/2011 06:03:46 ET JOB#: [id number]  cc: Jeffrey AmasJames S. Breella Vanostrand, MD, <Dictator> Lester CarolinaJAMES S Yoshiye Kraft MD ELECTRONICALLY SIGNED 12/06/2011  10:30 

## 2014-09-15 NOTE — H&P (Signed)
PATIENT NAME:  [name], [name] DATE OF BIRTH:  01-Jun-1932  DATE OF ADMISSION:  11/23/2011  PRIMARY CARE PHYSICIAN: Dr. Lazarus SalinesLott   CHIEF COMPLAINT: Altered mental status.   HISTORY OF PRESENT ILLNESS: [name] is a 81 year old pleasant Caucasian male with unremarkable past medical history. His daughter brought him to the Emergency Department for evaluation. He had altered mental status. This is ongoing for a while and getting worse. He is wandering to the neighbors at night asking them to fix his car battery. He was noticed also not taking care of himself, not taking a bath, not eating well, not sleeping. He is anxious and at times delusional. He thinks there are people at home. Sometimes he's confused and cannot recognize his daughter and does not remember some of the people that he is familiar with. He does not remember that his wife and his parents are dead.   REVIEW OF SYSTEMS: CONSTITUTIONAL: No fever. No chills. No fatigue. EYES: No blurring of vision. No double vision. ENT: No hearing impairment. No sore throat. No dysphagia. CARDIOVASCULAR: No chest pain. No shortness of breath. No syncope. RESPIRATORY: No cough. No sputum production. No shortness of breath. No chest pain. GASTROINTESTINAL: No abdominal pain. No nausea. No vomiting. No diarrhea. GENITOURINARY: No dysuria. No frequency of urination. MUSCULOSKELETAL: No joint pain or swelling. No muscular pain or swelling. INTEGUMENTARY: No skin rash. No ulcers. NEUROLOGY: No focal weakness. No seizure activity. No headache. PSYCHIATRY: He is calm but at times he gets anxious. No depression. He appears to be forgetful. ENDOCRINE: No heat or cold intolerance. No polyuria or polydipsia.   PAST MEDICAL HISTORY: Hypercholesterolemia.   PAST SURGICAL HISTORY: No prior history of surgery.   SOCIAL HABITS: Nonsmoker. He never smoked in the past. No history of alcohol or drug abuse.   SOCIAL HISTORY: He is widowed. Lives at home and  cared for by his daughter who has the power-of-attorney. The patient does have a LIVING WILL. The patient retired from working on tobacco farm. He is widowed.   FAMILY HISTORY: His father suffered from dementia. His mother died from bladder cancer.   ADMISSION MEDICATIONS:  1. Pravastatin 10 mg a day. 2. Aspirin 81 mg a day.   ALLERGIES: He has side effects from taking Lipitor, Zocor, lovastatin.   PHYSICAL EXAMINATION:   VITAL SIGNS: Blood pressure 167/93, respiratory rate 18, pulse 62, temperature 97.4, oxygen saturation 95%.   GENERAL APPEARANCE: Elderly healthy-looking male laying in bed in no acute distress.   HEAD: No pallor. No icterus. No cyanosis.   EARS, NOSE, AND THROAT: Hearing was normal. Nasal mucosa, lips, tongue were normal   EYES: Normal iris and conjunctivae. Pupils about 5 mm, equal and sluggishly reactive to light.   NECK: Supple. Trachea at midline. No thyromegaly. No cervical lymphadenopathy. No masses.   HEART: Normal S1, S2. No S3, S4. No murmur. Distant heart sounds. No carotid bruits.   RESPIRATORY: Normal breathing pattern without use of accessory muscles. No rales. No wheezing.   ABDOMEN: Soft without tenderness. No hepatosplenomegaly. No masses. No hernias.   SKIN: No ulcers. No subcutaneous nodules.   MUSCULOSKELETAL: No joint swelling. No clubbing.   NEUROLOGIC: Cranial nerves II through XII were intact. No focal motor deficit.   PSYCHIATRY: The patient is alert, disoriented to place and time. Does not know the month nor the year nor who is the [employer] president. Mood and affect were normal.   LABORATORY, DIAGNOSTIC, AND RADIOLOGICAL DATA: CAT scan  of the head apparently had a previous right craniotomy. There is large encephalomalacia cavity. There are left and right posterior burr holes present. There is also diffuse cerebral atrophy. There are also patchy foci of white matter, decreased attenuation indicative of chronic ischemia. No mass  lesions. No areas of decreased density to suggest a new infarction.   EKG showed sinus bradycardia at rate of 55 per minute, otherwise unremarkable EKG.  Serum glucose 116, BUN 4, creatinine 1.1, sodium 140, potassium 3.2. Liver function tests were normal. Troponin less than 0.02. CBC showed white count 5000, hemoglobin 16, hematocrit 49, platelet count 200,000. Urinalysis was negative.   ASSESSMENT:  1. Altered mental status.  2. Dementia.  3. 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  4. Elevated blood pressure, may indicate underlying hypertension.  5. Mild hypokalemia.   PLAN:  1. Will admit the patient for observation.  2. Follow-up on his neurologic status.  3. I will continue the aspirin and statin. 4. Correct the hypokalemia.  5. Monitor blood pressure. If subsequent readings are elevated, then he will need treatment.  6. Consult Social Services to discuss the situation with his daughter and consider replacement.  7. The patient does have a LIVING WILL and he had given the power-of-attorney to make medical decisions to his daughter, [name].        TIME SPENT EVALUATING THIS PATIENT: More than 50 minutes.   ____________________________ Carney [place]ers. Rudene Re, MD amd:drc D: 11/23/2011 01:08:54 ET T: 11/23/2011 08:21:19 ET JOB#: [id number]  cc: [place]ney [place]ers. Rudene Re, MD, <Dictator> Tamika J. Lazarus Salines, MD Zollie Scale MD ELECTRONICALLY SIGNED 11/24/2011 6:52

## 2014-09-15 NOTE — Consult Note (Signed)
PATIENT NAME:  [name], [name] MR#:  [id number] DATE OF BIRTH:  August 10, 1932  DATE OF CONSULTATION:  11/23/2011  REFERRING PHYSICIAN:  Dr. Allena KatzPatel  CONSULTING PHYSICIAN:  Adelene AmasJames S. Vinny Taranto, MD  REASON FOR CONSULTATION: Altered mental status.   HISTORY OF PRESENT ILLNESS: Mr. [name] is [name] 81 year old male presenting with difficulty with orientation as well as memory. He is demonstrating that he has no ability to care for himself, his difficulty with memory and judgment have been progressing. Over the past several months starting approximately three years ago his doctors were concerned that he will not be able to make rational choices regarding his care. Please see the mental status exam. The problem with his mentation is not just chronic. He has been more confused over the three days prior to admission. His daughter brought him in because he was not able to recognize his daughter over the past several days. He was not able to remember that his wife and his parents were dead. He was having false beliefs at times. She was noting that he was not taking [name] bath and was having very poor hygiene. He was having an elevated amount of agitation and was not sleeping. He was wandering over to the neighbors at night and irrationally asking them to fix his car battery. Again this had developed within the past 4 to 10 days prior to admission and had worsened to the point of intolerance over three days prior to presentation.   Efforts to redirect the patient by the family had failed both psychologically and socially.   PAST PSYCHIATRIC HISTORY: Mr. [name] does have [name] baseline history of progressive decreased memory ability which started approximately three years ago and has progressed, however, the acute worsening of his mentation is described above.   FAMILY PSYCHIATRIC HISTORY: None known.   SOCIAL HISTORY: Please see the above. Mr. [name] has never smoked. He is retired and medically disabled. He has no history of  illegal drugs or alcohol use. His daughter has been looking after his care.   ALLERGIES: Lipitor, Zocor, and lovastatin.  MEDICATIONS CURRENTLY:  1. Aspirin 81 mg per day. 2. Pravachol 10 mg daily. 3. Potassium 20 mEq per day. 4. Geodon 10 mg daily. 5. Haldol 2 mg q.4 hours p.r.n.   LABORATORY, DIAGNOSTIC, AND RADIOLOGICAL DATA: EKG QTc 436 ms.  BUN and creatinine unremarkable. Urinalysis epi none seen. WBC 1.   PAST MEDICAL HISTORY: The patient has hypercholesterolemia.   REVIEW OF SYSTEMS: Constitutional, HEENT, mouth, neurologic, psychiatric, cardiovascular, respiratory, gastrointestinal, genitourinary, skin, musculoskeletal, hematologic, lymphatic, endocrine, metabolic all unremarkable.  PHYSICAL EXAMINATION:   VITAL SIGNS:  Temperature 97.6, pulse 61, respiratory rate 22, blood pressure 164/89.    (Dictation Cut Off Here)  ____________________________ Adelene AmasJames S. Jadaya Sommerfield, MD [email or web address] D: 11/28/2011 17:25:00 ET T: 11/29/2011 05:51:17 ET JOB#: [id number]  cc: Adelene AmasJames S. Bijon Mineer, MD, <Dictator> Lester CarolinaJAMES S Lissandra Keil MD ELECTRONICALLY SIGNED 12/06/2011 10:30

## 2014-09-29 IMAGING — CT CT HEAD W/O CM
2 series · 15 of 30 positions shown, 19 images · non-contrast
Comparison: None available for comparison at time of study
interpretation.

CLINICAL DATA: Fall, combative.

EXAM:
CT HEAD WITHOUT CONTRAST
TECHNIQUE: Contiguous axial images were obtained from the base of the skull
through the vertex without intravenous contrast.

[Series 3: head w/o · axial · non-contrast · 0.49mm/px · z∈[+96,+226]mm · 13 of 32 slices shown, 17 images]
[im 3/32  brain]
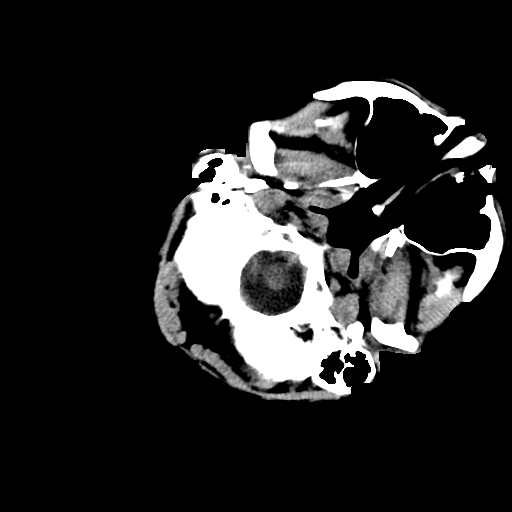
[im 3/32  bone]
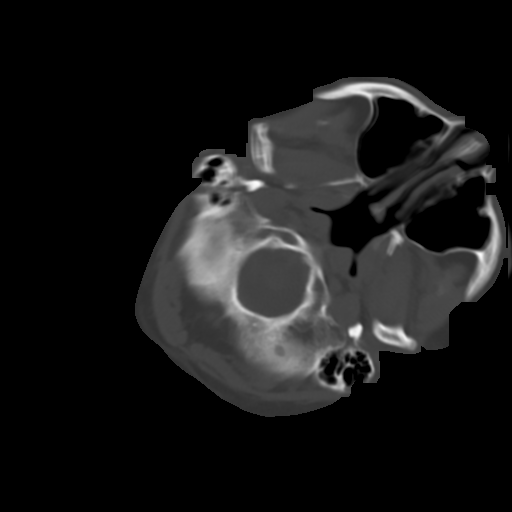
[im 5/32  brain]
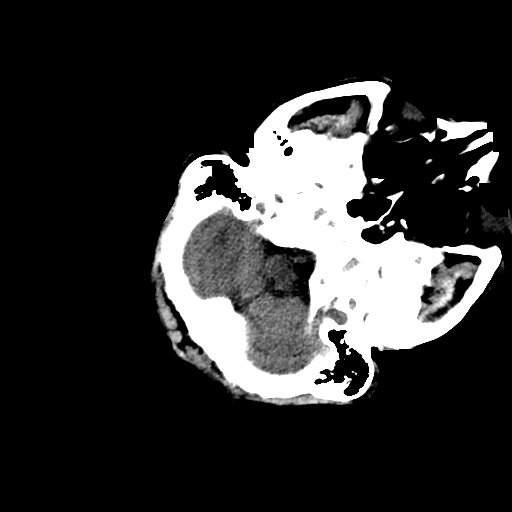
[im 7/32  brain]
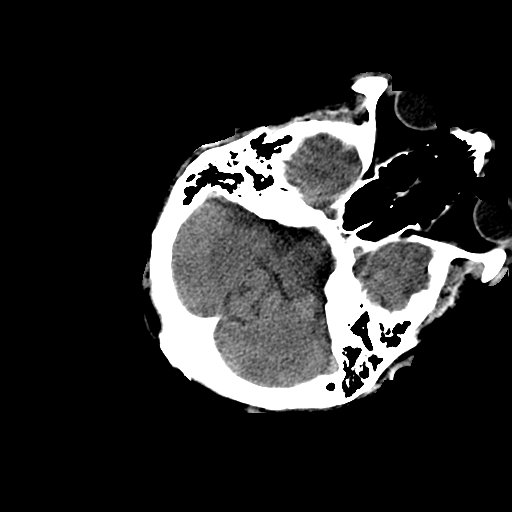
[im 9/32  brain]
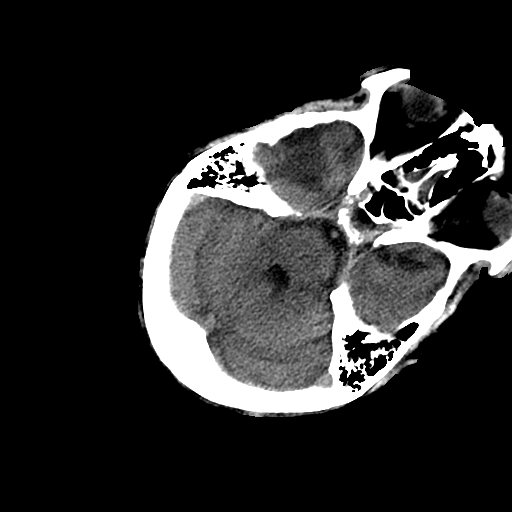
[im 12/32  brain]
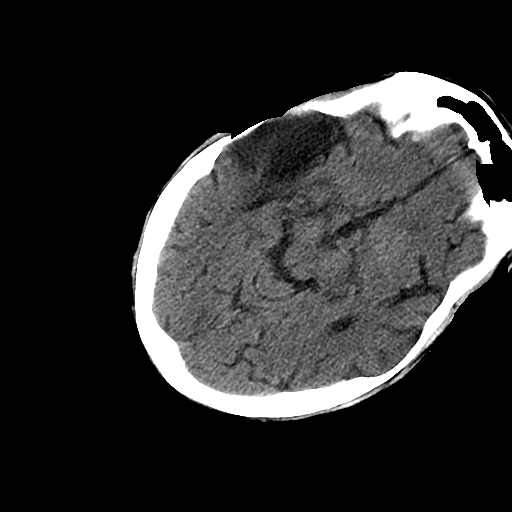
[im 12/32  bone]
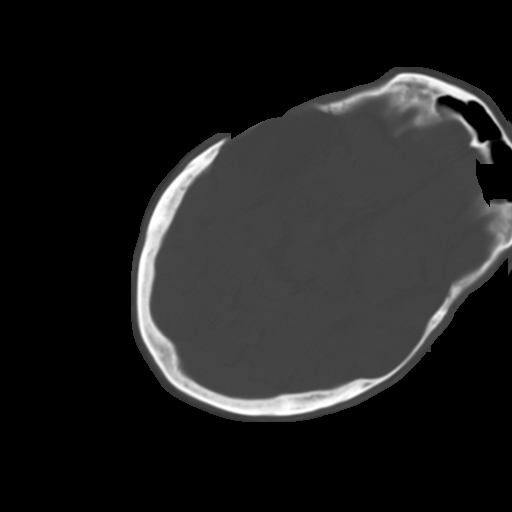
[im 14/32  brain]
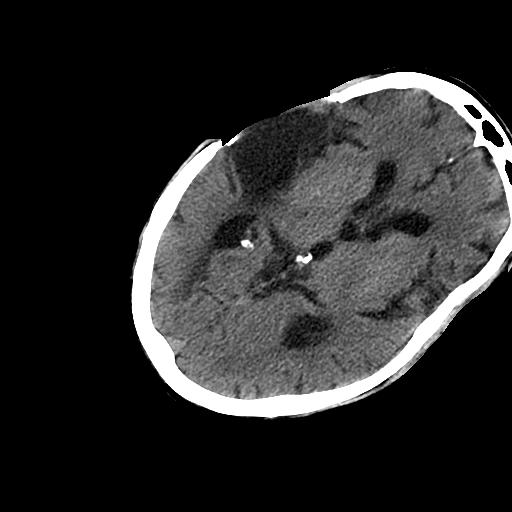
[im 16/32  brain]
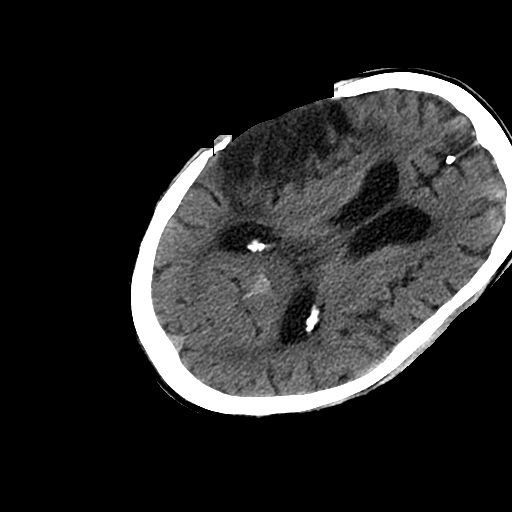
[im 18/32  brain]
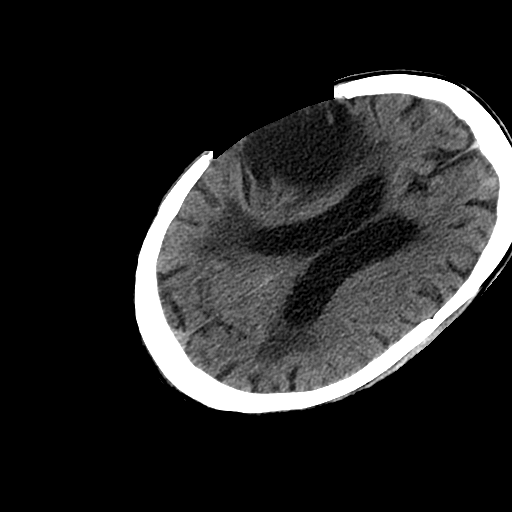
[im 20/32  brain]
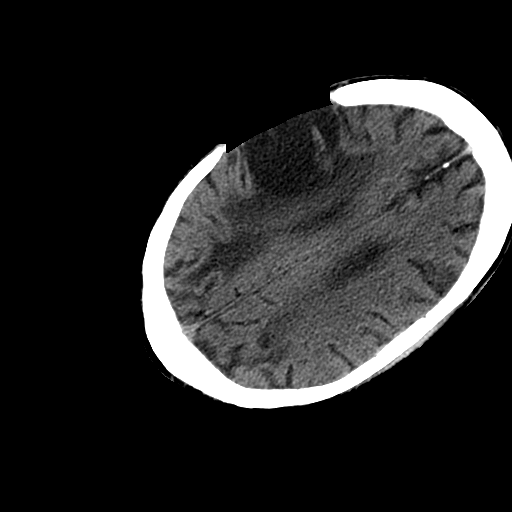
[im 20/32  bone]
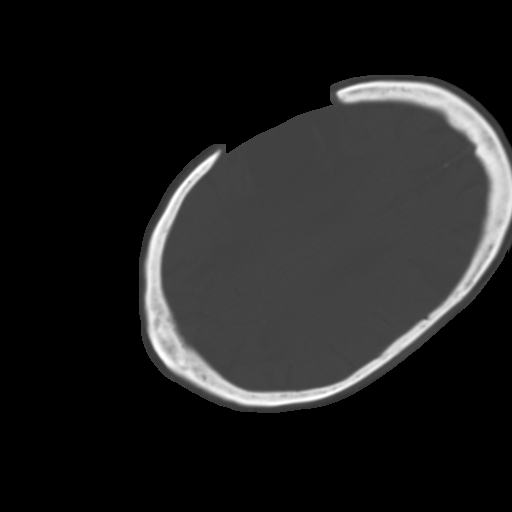
[im 23/32  brain]
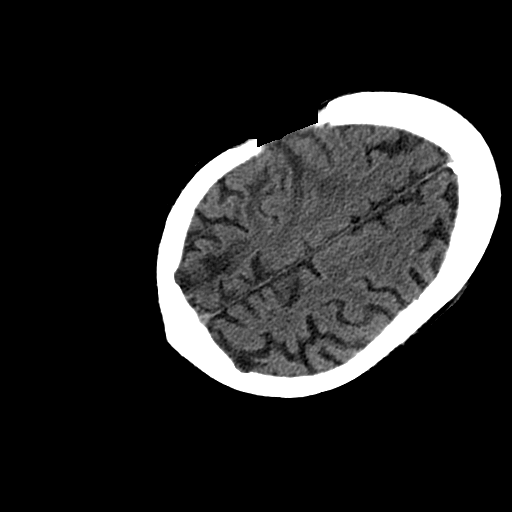
[im 25/32  brain]
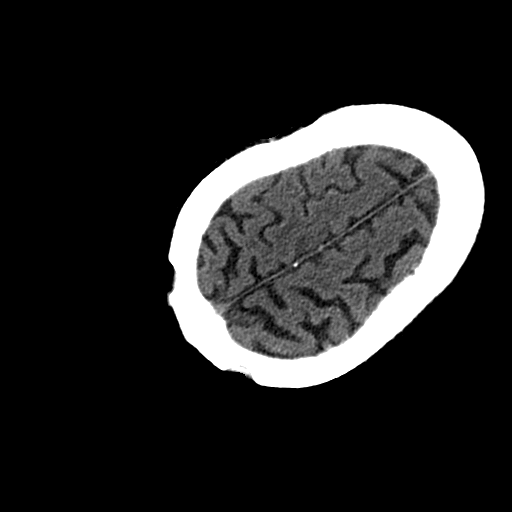
[im 27/32  brain]
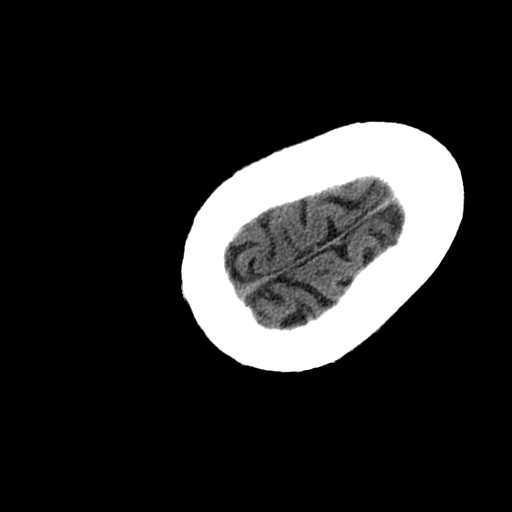
[im 29/32  brain]
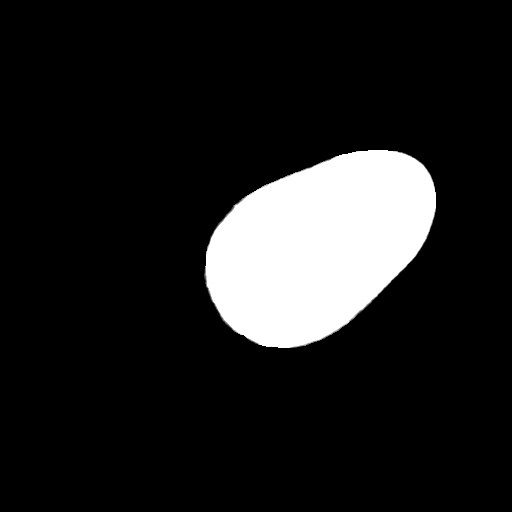
[im 29/32  bone]
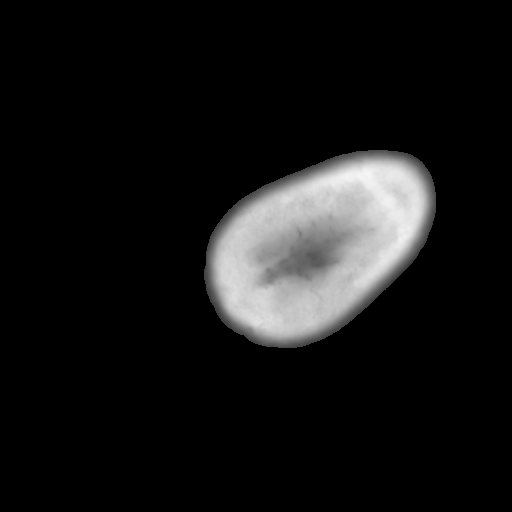

[Series 4: head w/o bone · axial · non-contrast · 0.49mm/px · z∈[+96,+116]mm · 2 of 32 slices shown]
[im 3/32  bone]
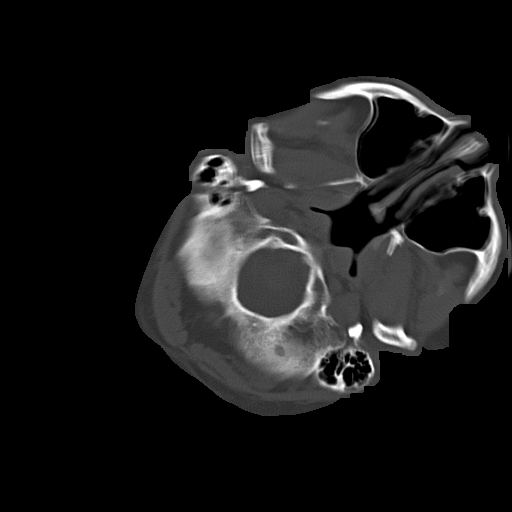
[im 7/32  bone]
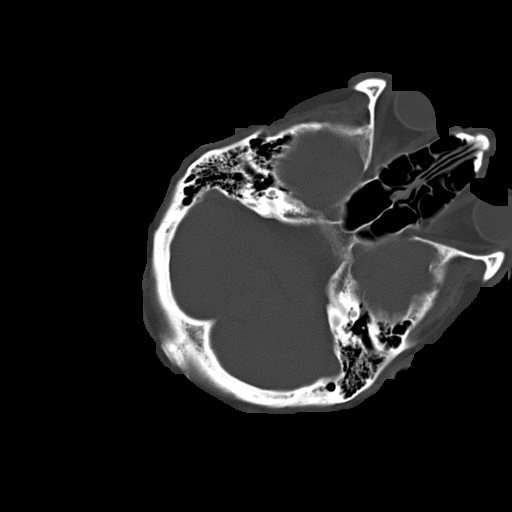

[15 of 30 positions shown; findings below may reference images not displayed]

FINDINGS: Patient is tilted in the scanner, limiting evaluation. No
intraparenchymal hemorrhage, mass effect or midline shift. Right
frontal temporal craniectomy, with underlying mildly masslike cystic
changes involving the right frontotemporal/insula. Bilateral
parietal encephalomalacia. No acute large vascular territory
infarct. The ventricles and sulci are overall normal for patient's
age, with symmetric lateral ventricles. Patchy to confluent
supratentorial white matter hypodensities suggest sequelae of
moderate to severe chronic small vessel ischemic disease.

Basal cisterns are patent. No skull fracture. Visualized paranasal
sinuses and mastoid air cells are well aerated. Status post apparent
bilateral ocular lens implants.
IMPRESSION: No acute intracranial process.

Right frontotemporal craniectomy, underlying cystic changes suggest
encephalomalacia and remote right middle cerebral artery territory
infarct though, there is a somewhat mass like appearance, recommend
correlation with prior imaging or followup MRI of the brain with
contrast as clinically indicated. Biparietal encephalomalacia
suggest remote traumatic insult.

  By: Alain Daniel Ajon

## 2014-09-29 IMAGING — CR DG CHEST 1V PORT
1 series · 1 of 1 positions shown · non-contrast
Comparison: No priors.

CLINICAL DATA: Seizures. History of fall.

EXAM:
PORTABLE CHEST - 1 VIEW

[AP]
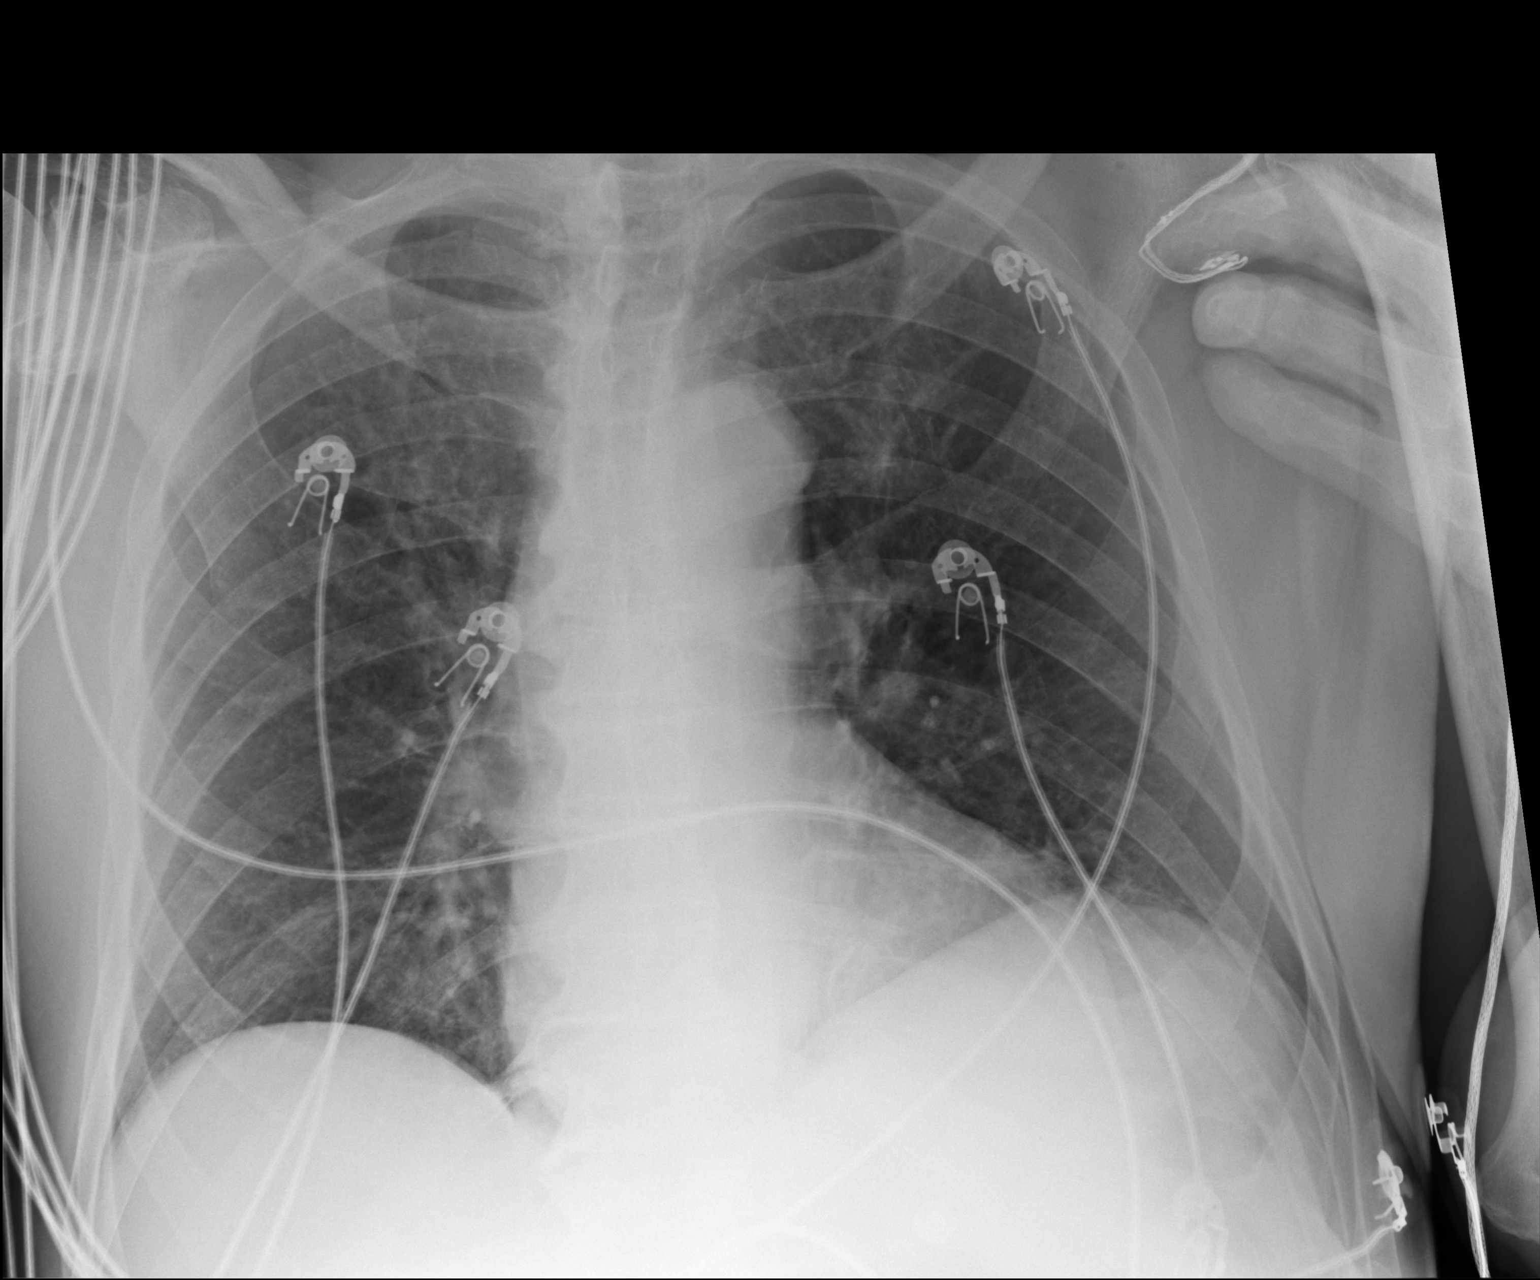

[1 of 1 positions shown; findings below may reference images not displayed]

FINDINGS: Lung volumes are low. No consolidative airspace disease. No pleural
effusions. No pneumothorax. No pulmonary nodule or mass noted.
Pulmonary vasculature and the cardiomediastinal silhouette are
within normal limits.
IMPRESSION: 1.  No radiographic evidence of acute cardiopulmonary disease.

## 2014-10-15 IMAGING — CR DG CHEST 2V
2 series · 2 of 2 positions shown · non-contrast
Comparison: 03/26/2013

CLINICAL DATA: Medical clearance

EXAM:
CHEST  2 VIEW

[w chest lat]
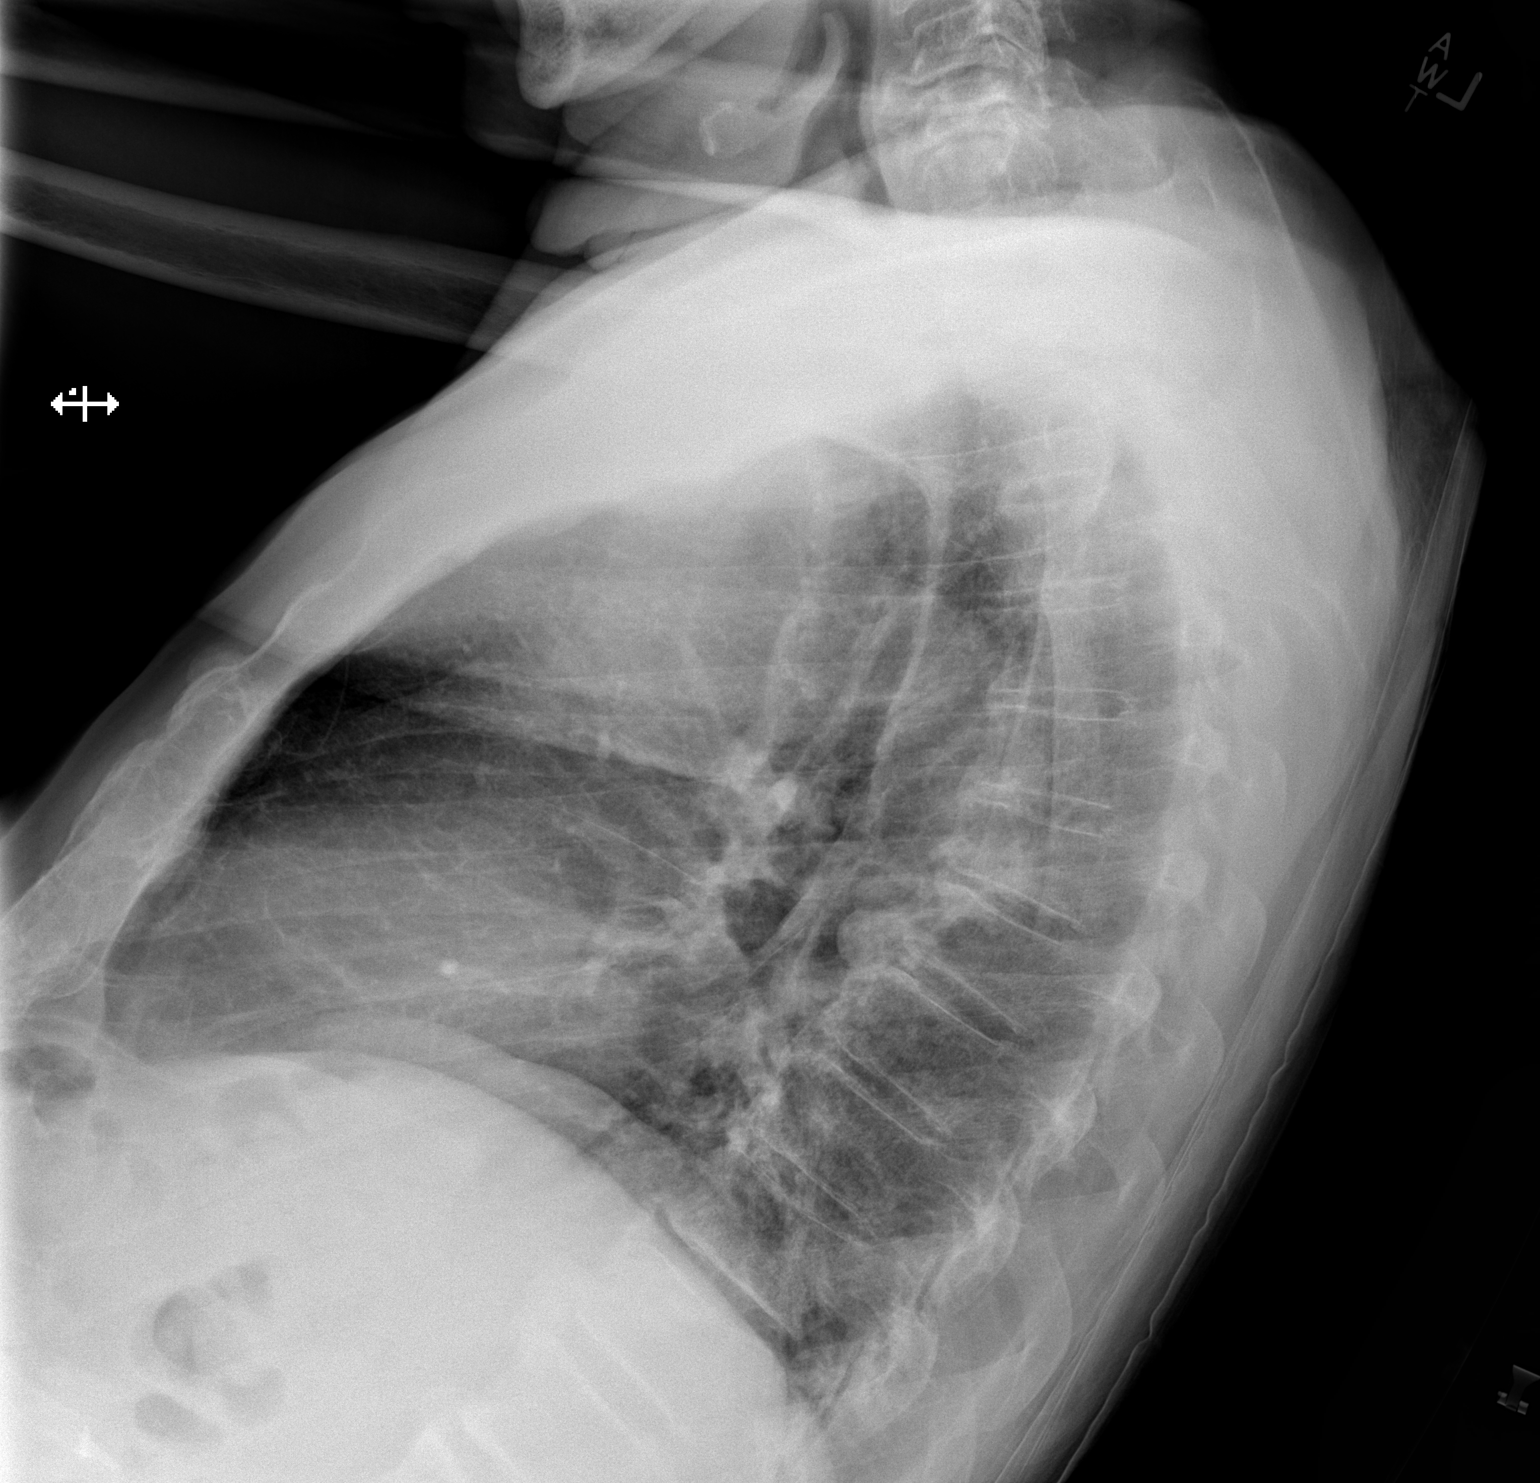

[x chest ap]
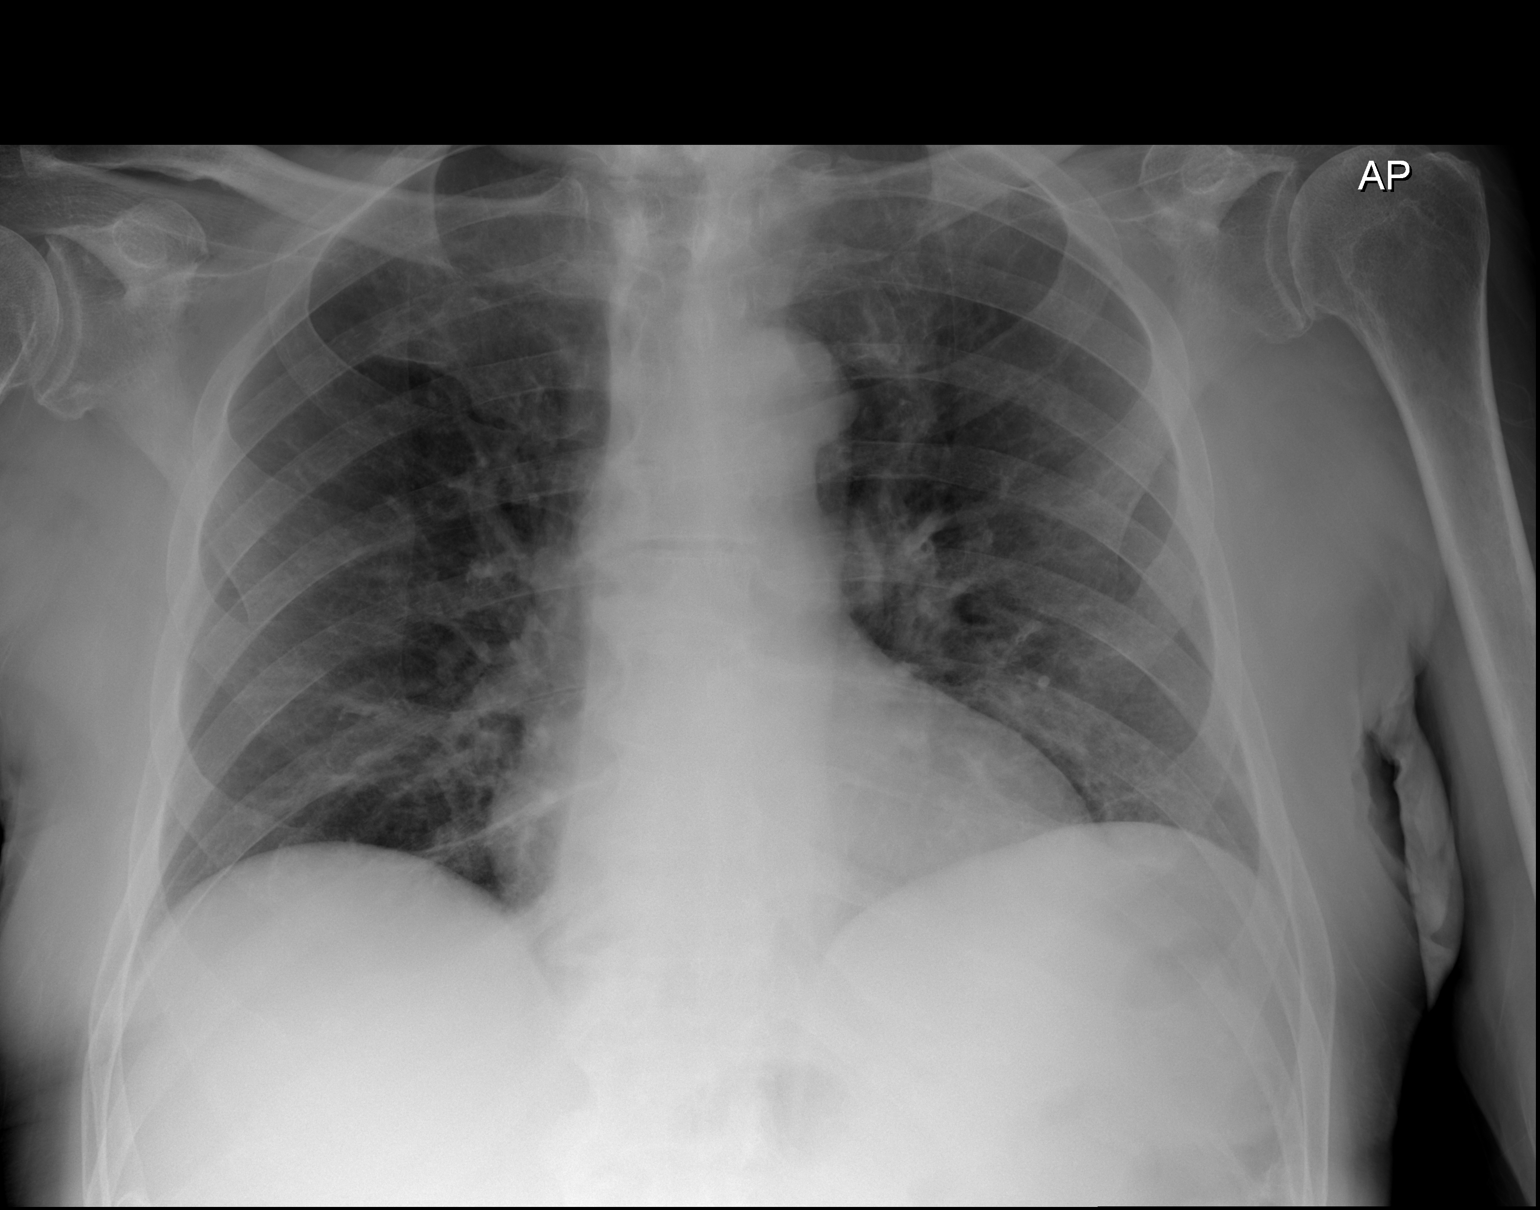

[2 of 2 positions shown; findings below may reference images not displayed]

FINDINGS: Two view exam shows low lung volumes. No focal airspace
consolidation. No pulmonary edema or pleural effusion.
Cardiopericardial silhouette is at upper limits of normal for size.
Interstitial markings are diffusely coarsened with chronic features.
Imaged bony structures of the thorax are intact.
IMPRESSION: Stable. Chronic interstitial coarsening without acute
cardiopulmonary process.
